# Patient Record
Sex: Female | Born: 1955 | Race: Black or African American | Hispanic: No | Marital: Single | Smoking: Never smoker
Health system: Southern US, Community
[De-identification: ages and names within clinical notes are randomized; demographics above are authoritative.]

## PROBLEM LIST (undated history)

## (undated) DIAGNOSIS — N19 Unspecified kidney failure: Secondary | ICD-10-CM

## (undated) DIAGNOSIS — I10 Essential (primary) hypertension: Secondary | ICD-10-CM

## (undated) DIAGNOSIS — E119 Type 2 diabetes mellitus without complications: Secondary | ICD-10-CM

---

## 2020-08-05 ENCOUNTER — Encounter (HOSPITAL_COMMUNITY): Payer: Self-pay

## 2020-08-05 ENCOUNTER — Other Ambulatory Visit: Payer: Self-pay

## 2020-08-05 ENCOUNTER — Emergency Department (HOSPITAL_COMMUNITY)
Admission: EM | Admit: 2020-08-05 | Discharge: 2020-08-05 | Disposition: A | Discharge status: Home / Self Care | Payer: Medicaid Other | Attending: Emergency Medicine | Admitting: Emergency Medicine

## 2020-08-05 DIAGNOSIS — Z7984 Long term (current) use of oral hypoglycemic drugs: Secondary | ICD-10-CM | POA: Diagnosis not present

## 2020-08-05 DIAGNOSIS — I10 Essential (primary) hypertension: Secondary | ICD-10-CM | POA: Diagnosis not present

## 2020-08-05 DIAGNOSIS — E119 Type 2 diabetes mellitus without complications: Secondary | ICD-10-CM | POA: Insufficient documentation

## 2020-08-05 DIAGNOSIS — Z760 Encounter for issue of repeat prescription: Secondary | ICD-10-CM | POA: Diagnosis not present

## 2020-08-05 DIAGNOSIS — Z79899 Other long term (current) drug therapy: Secondary | ICD-10-CM | POA: Insufficient documentation

## 2020-08-05 HISTORY — DX: Type 2 diabetes mellitus without complications: E11.9

## 2020-08-05 HISTORY — DX: Unspecified kidney failure: N19

## 2020-08-05 HISTORY — DX: Essential (primary) hypertension: I10

## 2020-08-05 MED ORDER — VALACYCLOVIR HCL 1 G PO TABS: 1000.0000 mg | ORAL_TABLET | Freq: Every day | ORAL | 1 refills | Status: AC

## 2020-08-05 MED ORDER — IRBESARTAN 300 MG PO TABS: 300.0000 mg | ORAL_TABLET | Freq: Every day | ORAL | 1 refills | Status: AC

## 2020-08-05 MED ORDER — AMLODIPINE BESYLATE 10 MG PO TABS: 10.0000 mg | ORAL_TABLET | Freq: Every day | ORAL | 1 refills | Status: AC

## 2020-08-05 MED ORDER — GLIMEPIRIDE 4 MG PO TABS: 4.0000 mg | ORAL_TABLET | Freq: Every day | ORAL | 1 refills | Status: AC

## 2020-08-05 MED ORDER — GLIMEPIRIDE 1 MG PO TABS: 1.0000 mg | ORAL_TABLET | Freq: Every day | ORAL | 1 refills | Status: AC

## 2020-08-05 NOTE — Discharge Instructions (Addendum)
Continue to work on finding a primary care provider to supply your medication needs and medical assessment.  Follow-up with them as soon as possible for a checkup.

## 2020-08-05 NOTE — ED Provider Notes (Signed)
[facility]-EMERGENCY DEPT Provider Note   CSN: [id number] Arrival date & time: 08/05/20  1237     History Chief Complaint  Patient presents with  . Medication Refill    Kristin Montoya is a 65 y.o. female.  HPI She presents for express purpose of a fall medications.  She is out of all her medicines with exception of glimepiride 4 mg.  She moved to [place] from [place], 2 months ago and is working on getting [employer].  That is in process and she expects to get it soon.  She is also working on finding a primary care provider.  She reports some shakiness, but denies other symptoms including; cough, chest pain, focal weakness or paresthesia.  There is been no nausea or vomiting.  There are no other known modifying factors.    Past Medical History:  Diagnosis Date  . Diabetes mellitus without complication (HCC)   . Hypertension   . Kidney failure     There are no problems to display for this patient.   History reviewed. No pertinent surgical history.   OB History   No obstetric history on file.     History reviewed. No pertinent family history.  Social History   Tobacco Use  . Smoking status: Never Smoker  . Smokeless tobacco: Never Used    Home Medications Prior to Admission medications   Medication Sig Start Date End Date Taking? Authorizing Provider  amLODipine (NORVASC) 10 MG tablet Take 1 tablet (10 mg total) by mouth daily. 08/05/20  Yes Mancel Bale, MD  glimepiride (AMARYL) 1 MG tablet Take 1 tablet (1 mg total) by mouth daily with breakfast. 08/05/20  Yes Mancel Bale, MD  glimepiride (AMARYL) 4 MG tablet Take 1 tablet (4 mg total) by mouth daily with breakfast. 08/05/20  Yes Mancel Bale, MD  irbesartan (AVAPRO) 300 MG tablet Take 1 tablet (300 mg total) by mouth daily. 08/05/20  Yes Mancel Bale, MD  valACYclovir (VALTREX) 1000 MG tablet Take 1 tablet (1,000 mg total) by mouth daily for 14 days. 08/05/20 08/19/20 Yes  Mancel Bale, MD    Allergies    Patient has no known allergies.  Review of Systems   Review of Systems  All other systems reviewed and are negative.   Physical Exam Updated Vital Signs BP (!) 180/88 (BP Location: Right Arm)   Pulse 94   Temp 97.7 F (36.5 C) (Oral)   Resp 17   SpO2 98%   Physical Exam Vitals and nursing note reviewed.  Constitutional:      General: She is not in acute distress.    Appearance: She is well-developed. She is obese. She is not ill-appearing, toxic-appearing or diaphoretic.  HENT:     Head: Normocephalic and atraumatic.     Right Ear: External ear normal.     Left Ear: External ear normal.  Eyes:     Conjunctiva/sclera: Conjunctivae normal.     Pupils: Pupils are equal, round, and reactive to light.  Neck:     Trachea: Phonation normal.  Cardiovascular:     Rate and Rhythm: Normal rate.  Pulmonary:     Effort: Pulmonary effort is normal.  Abdominal:     General: There is no distension.  Musculoskeletal:        General: Normal range of motion.     Cervical back: Normal range of motion and neck supple.  Skin:    General: Skin is warm and dry.  Neurological:  Mental Status: She is alert and oriented to person, place, and time.     Cranial Nerves: No cranial nerve deficit.     Sensory: No sensory deficit.     Motor: No abnormal muscle tone.     Coordination: Coordination normal.  Psychiatric:        Mood and Affect: Mood normal.        Behavior: Behavior normal.        Thought Content: Thought content normal.        Judgment: Judgment normal.     ED Results / Procedures / Treatments   Labs (all labs ordered are listed, but only abnormal results are displayed) Labs Reviewed - No data to display  EKG None  Radiology No results found.  Procedures Procedures   Medications Ordered in ED Medications - No data to display  ED Course  I have reviewed the triage vital signs and the nursing notes.  Pertinent labs &  imaging results that were available during my care of the patient were reviewed by me and considered in my medical decision making (see chart for details).    MDM Rules/Calculators/A&P                           Patient Vitals for the past 24 hrs:  BP Temp Temp src Pulse Resp SpO2  08/05/20 1245 (!) 180/88 97.7 F (36.5 C) Oral 94 17 98 %    1:29 PM Reevaluation with update and discussion. After initial assessment and treatment, an updated evaluation reveals no change in findings, findings discussed, questions answered. Mancel Bale   Medical Decision Making:  This patient is presenting for evaluation of requirement for refill of medications, which does not require a range of treatment options, and is not a complaint that involves a high risk of morbidity and mortality.   Critical Interventions-clinical evaluation, discussion with patient  After These Interventions, the Patient was reevaluated and was found stable for discharge.  Mild hypertension related to not having 2 of her blood pressure medications.  No clinical evidence for hypertensive urgency.  Doubt unstable metabolic process.  Will write prescriptions, to cover her until she can get a PCP, locally.  CRITICAL CARE-no Performed by: Mancel Bale  Nursing Notes Reviewed/ Care Coordinated Applicable Imaging Reviewed Interpretation of Laboratory Data incorporated into ED treatment  The patient appears reasonably screened and/or stabilized for discharge and I doubt any other medical condition or other [facility] requiring further screening, evaluation, or treatment in the ED at this time prior to discharge.  Plan: Home Medications-continue usual medication; Home Treatments-low-carb diet, low-salt diet; return here if the recommended treatment, does not improve the symptoms; Recommended follow up-PCP ASAP     Final Clinical Impression(s) / ED Diagnoses Final diagnoses:  Medication refill    Rx / DC Orders ED Discharge Orders          Ordered    glimepiride (AMARYL) 1 MG tablet  Daily with breakfast        08/05/20 1324    glimepiride (AMARYL) 4 MG tablet  Daily with breakfast        08/05/20 1324    irbesartan (AVAPRO) 300 MG tablet  Daily        08/05/20 1324    amLODipine (NORVASC) 10 MG tablet  Daily        08/05/20 1324    valACYclovir (VALTREX) 1000 MG tablet  Daily  08/05/20 1324           Mancel Bale, MD 08/05/20 1330

## 2020-08-05 NOTE — ED Triage Notes (Signed)
Pt arrived via walk in, states she has not been feeling well since she stopped taking her medications. States she has been out for about a month, and looking to get refills. Pt recently moved to the area and does not have a PCP established to refill meds yet.

## 2021-07-06 ENCOUNTER — Other Ambulatory Visit: Payer: Self-pay | Admitting: Family Medicine

## 2021-07-06 DIAGNOSIS — Z1231 Encounter for screening mammogram for malignant neoplasm of breast: Secondary | ICD-10-CM

## 2021-07-20 ENCOUNTER — Ambulatory Visit
Admission: RE | Admit: 2021-07-20 | Discharge: 2021-07-20 | Disposition: A | Discharge status: Home / Self Care | Payer: Medicaid Other | Source: Ambulatory Visit | Attending: Family Medicine | Admitting: Family Medicine

## 2021-07-20 ENCOUNTER — Other Ambulatory Visit: Payer: Self-pay | Admitting: Family Medicine

## 2021-07-20 DIAGNOSIS — R52 Pain, unspecified: Secondary | ICD-10-CM

## 2021-07-20 DIAGNOSIS — Z1231 Encounter for screening mammogram for malignant neoplasm of breast: Secondary | ICD-10-CM

## 2021-07-20 DIAGNOSIS — M898X8 Other specified disorders of bone, other site: Secondary | ICD-10-CM

## 2021-08-14 ENCOUNTER — Emergency Department (HOSPITAL_COMMUNITY)
Admission: EM | Admit: 2021-08-14 | Discharge: 2021-08-14 | Disposition: A | Discharge status: Home / Self Care | Payer: Medicaid Other | Attending: Emergency Medicine | Admitting: Emergency Medicine

## 2021-08-14 ENCOUNTER — Other Ambulatory Visit: Payer: Self-pay

## 2021-08-14 DIAGNOSIS — T383X5A Adverse effect of insulin and oral hypoglycemic [antidiabetic] drugs, initial encounter: Secondary | ICD-10-CM | POA: Diagnosis not present

## 2021-08-14 DIAGNOSIS — Z79899 Other long term (current) drug therapy: Secondary | ICD-10-CM | POA: Insufficient documentation

## 2021-08-14 DIAGNOSIS — Z7984 Long term (current) use of oral hypoglycemic drugs: Secondary | ICD-10-CM | POA: Insufficient documentation

## 2021-08-14 DIAGNOSIS — I10 Essential (primary) hypertension: Secondary | ICD-10-CM | POA: Diagnosis not present

## 2021-08-14 DIAGNOSIS — E119 Type 2 diabetes mellitus without complications: Secondary | ICD-10-CM | POA: Diagnosis not present

## 2021-08-14 DIAGNOSIS — R7989 Other specified abnormal findings of blood chemistry: Secondary | ICD-10-CM | POA: Insufficient documentation

## 2021-08-14 DIAGNOSIS — D72829 Elevated white blood cell count, unspecified: Secondary | ICD-10-CM | POA: Diagnosis not present

## 2021-08-14 DIAGNOSIS — R42 Dizziness and giddiness: Secondary | ICD-10-CM | POA: Insufficient documentation

## 2021-08-14 DIAGNOSIS — R5383 Other fatigue: Secondary | ICD-10-CM

## 2021-08-14 DIAGNOSIS — T50905A Adverse effect of unspecified drugs, medicaments and biological substances, initial encounter: Secondary | ICD-10-CM

## 2021-08-14 LAB — URINALYSIS, ROUTINE W REFLEX MICROSCOPIC
Bilirubin Urine: NEGATIVE
Glucose, UA: NEGATIVE mg/dL
Hgb urine dipstick: NEGATIVE
Ketones, ur: NEGATIVE mg/dL
Leukocytes,Ua: NEGATIVE
Nitrite: NEGATIVE
Protein, ur: NEGATIVE mg/dL
Specific Gravity, Urine: 1.012 (ref 1.005–1.030)
pH: 6 (ref 5.0–8.0)

## 2021-08-14 LAB — BASIC METABOLIC PANEL
Anion gap: 9 (ref 5–15)
BUN: 21 mg/dL (ref 8–23)
CO2: 25 mmol/L (ref 22–32)
Calcium: 11 mg/dL — ABNORMAL HIGH (ref 8.9–10.3)
Chloride: 102 mmol/L (ref 98–111)
Creatinine, Ser: 1.14 mg/dL — ABNORMAL HIGH (ref 0.44–1.00)
GFR, Estimated: 54 mL/min — ABNORMAL LOW (ref >60)
Glucose, Bld: 78 mg/dL (ref 70–99)
Potassium: 4 mmol/L (ref 3.5–5.1)
Sodium: 136 mmol/L (ref 135–145)

## 2021-08-14 LAB — CBC
HCT: 44.9 % (ref 36.0–46.0)
Hemoglobin: 14.5 g/dL (ref 12.0–15.0)
MCH: 27.8 pg (ref 26.0–34.0)
MCHC: 32.3 g/dL (ref 30.0–36.0)
MCV: 86.2 fL (ref 80.0–100.0)
Platelets: 303 10*3/uL (ref 150–400)
RBC: 5.21 MIL/uL — ABNORMAL HIGH (ref 3.87–5.11)
RDW: 13.6 % (ref 11.5–15.5)
WBC: 11.6 10*3/uL — ABNORMAL HIGH (ref 4.0–10.5)
nRBC: 0 % (ref 0.0–0.2)

## 2021-08-14 LAB — CBG MONITORING, ED: Glucose-Capillary: 71 mg/dL (ref 70–99)

## 2021-08-14 MED ORDER — SODIUM CHLORIDE 0.9 % IV BOLUS
1000.0000 mL | Freq: Once | INTRAVENOUS | Status: AC
  Administered 2021-08-14: 1000 mL via INTRAVENOUS

## 2021-08-14 NOTE — ED Provider Notes (Signed)
?[facility] ?Provider Note ? ? ?CSN: [id number] ?Arrival date & time: 08/14/21  1111 ? ?  ? ?History ? ?No chief complaint on file. ? ? ?Kristin AsaDenise Deming Montoya is a 66 y.o. female who presents emergency department complaining of dizziness x 1 week.  She states that she recently was started on Ozempic, and believes that her symptoms began after that.  She reports her symptoms are worse when she first gets up in the morning.  About 3 nights ago she did wake up and have blurry vision of the right eye which resolved on its own. She endorses regularly checks her blood sugar at home, and the highest reading that she is seeing has been about 80, and the lowest reading she seen has been about 50.  She also checked her blood pressure yesterday and had a systolic of 114, which is lower than she is used to. ? ?HPI ? ?  ? ?Home Medications ?Prior to Admission medications   ?Medication Sig Start Date End Date Taking? Authorizing Provider  ?amLODipine (NORVASC) 10 MG tablet Take 1 tablet (10 mg total) by mouth daily. 08/05/20   Mancel BaleWentz, Elliott, MD  ?glimepiride (AMARYL) 1 MG tablet Take 1 tablet (1 mg total) by mouth daily with breakfast. 08/05/20   Mancel BaleWentz, Elliott, MD  ?glimepiride (AMARYL) 4 MG tablet Take 1 tablet (4 mg total) by mouth daily with breakfast. 08/05/20   Mancel BaleWentz, Elliott, MD  ?irbesartan (AVAPRO) 300 MG tablet Take 1 tablet (300 mg total) by mouth daily. 08/05/20   Mancel BaleWentz, Elliott, MD  ?   ? ?Allergies    ?Patient has no known allergies.   ? ?Review of Systems   ?Review of Systems  ?Constitutional:  Negative for fever.  ?HENT:  Negative for congestion.   ?Eyes:  Positive for visual disturbance.  ?     Blurry vision  ?Respiratory:  Negative for cough and shortness of breath.   ?Cardiovascular:  Negative for chest pain.  ?Gastrointestinal:  Negative for abdominal pain, constipation, diarrhea, nausea and vomiting.  ?Neurological:  Positive for dizziness and weakness. Negative for syncope, speech  difficulty, numbness and headaches.  ?All other systems reviewed and are negative. ? ?Physical Exam ?Updated Vital Signs ?BP (!) 141/75   Pulse 82   Temp 97.9 ?F (36.6 ?C) (Oral)   Resp 17   Ht 5\' 5"  (1.651 m)   Wt 84.8 kg   SpO2 100%   BMI 31.12 kg/m?  ?Physical Exam ?Vitals and nursing note reviewed.  ?Constitutional:   ?   Appearance: Normal appearance. She is not toxic-appearing.  ?   Comments: Patient appears tired  ?HENT:  ?   Head: Normocephalic and atraumatic.  ?   Right Ear: Tympanic membrane, ear canal and external ear normal.  ?   Left Ear: Tympanic membrane, ear canal and external ear normal.  ?   Nose: Nose normal.  ?   Mouth/Throat:  ?   Mouth: Mucous membranes are moist.  ?Eyes:  ?   Extraocular Movements: Extraocular movements intact.  ?   Conjunctiva/sclera: Conjunctivae normal.  ?   Pupils: Pupils are equal, round, and reactive to light.  ?Cardiovascular:  ?   Rate and Rhythm: Normal rate and regular rhythm.  ?Pulmonary:  ?   Effort: Pulmonary effort is normal. No respiratory distress.  ?   Breath sounds: Normal breath sounds.  ?Abdominal:  ?   General: There is no distension.  ?   Palpations: Abdomen is soft.  ?  Tenderness: There is no abdominal tenderness.  ?Skin: ?   General: Skin is warm and dry.  ?Neurological:  ?   General: No focal deficit present.  ?   Mental Status: She is alert.  ?   Comments: Neuro: Speech is clear, able to follow commands. CN III-XII intact grossly intact. PERRLA. EOMI. Sensation intact throughout. Str 5/5 all extremities.  ? ? ?ED Results / Procedures / Treatments   ?Labs ?(all labs ordered are listed, but only abnormal results are displayed) ?Labs Reviewed  ?BASIC METABOLIC PANEL - Abnormal; Notable for the following components:  ?    Result Value  ? Creatinine, Ser 1.14 (*)   ? Calcium 11.0 (*)   ? GFR, Estimated 54 (*)   ? All other components within normal limits  ?CBC - Abnormal; Notable for the following components:  ? WBC 11.6 (*)   ? RBC 5.21 (*)   ?  All other components within normal limits  ?URINALYSIS, ROUTINE W REFLEX MICROSCOPIC - Abnormal; Notable for the following components:  ? Color, Urine STRAW (*)   ? APPearance HAZY (*)   ? All other components within normal limits  ?CBG MONITORING, ED  ? ? ?EKG ?EKG Interpretation ? ?Date/Time:  Saturday August 14 2021 11:34:54 EDT ?Ventricular Rate:  84 ?PR Interval:  154 ?QRS Duration: 82 ?QT Interval:  351 ?QTC Calculation: 415 ?R Axis:   27 ?Text Interpretation: Sinus rhythm Abnormal R-wave progression, early transition Confirmed by Coralee Pesa [phone]) on 08/14/2021 1:26:34 PM ? ?Radiology ?No results found. ? ?Procedures ?Procedures  ? ? ?Medications Ordered in ED ?Medications  ?sodium chloride 0.9 % bolus 1,000 mL (0 mLs Intravenous Stopped 08/14/21 1418)  ? ? ?ED Course/ Medical Decision Making/ A&P ?  ?                        ?Medical Decision Making ?Amount and/or Complexity of Data Reviewed ?Labs: ordered. ? ? ?This patient presents to the ED for concern of dizziness, this involves an extensive number of treatment options, and is a complaint that carries with it a high risk of complications and morbidity. The emergent differential diagnosis prior to evaluation includes, but is not limited to, Benign paroxysmal positional vertigo, vestibular migraine, head trauma, vertebrobasilar insuffciency, AVM, intracranial tumor, multiple sclerosis, drug-related (anticonvulsants, antibiotics, hypnotics, analgesics, alcohol), CVA, vasovagal syncope, orthostatic hypotension, sepsis, hypoglycemia, electrolyte disturbance, symptomatic anemia, dehydration, anxiety/panic attack. ? ?This is not an exhaustive differential.  ? ?Past Medical History / Co-morbidities / Social History: ?Diabetes, hypertension, kidney failure ? ?Additional history: ?Additional history obtained from chart review. External records from outside source obtained and reviewed including patient initially started on these medications reported back in 2022,  the Ozempic was just added this week. ? ?Physical Exam: ?Physical exam performed. The pertinent findings include: Patient is afebrile, not tachycardic, not hypoxic, no acute distress.  Normal neurologic exam as above.  Patient appears tired, but overall nontoxic-appearing.  No aggravating or alleviating factors or maneuvers. ? ?Lab Tests: ?I ordered, and personally interpreted labs.  The pertinent results include: Mild leukocytosis of 11,600, likely incidental finding.  Normal hemoglobin.  Creatinine 1.14, no labs to compare to.  Urinalysis negative for infection.  CBG 71. ?  ?Cardiac Monitoring:  ?The patient was maintained on a cardiac monitor.  My attending physician Dr. Wilkie Aye viewed and interpreted the cardiac monitored which showed an underlying rhythm of: normal sinus rhythm. I agree with this interpretation.  ?  ?Disposition: ?After  consideration of the diagnostic results and the patients response to treatment, I feel that patient is not requiring admission or inpatient treatment for her symptoms.  Patient was given a liter of IV fluids and was able to ambulate to the restroom without difficulty.  She states she only feels mildly improved.  During her 3-1/2-hour observation in the emergency department she had no worsening of her symptoms.  I think her symptoms are likely related to her recent medication change of Ozempic.  I advised the patient to stop this medication, and follow-up with her primary doctor.  Overall her lab work and physical exam have been reassuring today, and I have low suspicion for other acute etiology as in her symptoms.  We have discussed reasons to return to the emergency department, and patient is agreeable to the plan. ? ?I discussed this case with my attending physician Dr. Wilkie Aye who cosigned this note including patient's presenting symptoms, physical exam, and planned diagnostics and interventions. Attending physician stated agreement with plan or made changes to plan which were  implemented.   ? ?Final Clinical Impression(s) / ED Diagnoses ?Final diagnoses:  ?Dizziness  ?Fatigue, unspecified type  ?Adverse effect of drug, initial encounter  ? ? ?Rx / DC Orders ?ED Discharge Orders

## 2021-08-14 NOTE — ED Notes (Signed)
Pt ambulated to bathroom without difficulty, no respiratory distress noted.  ?

## 2021-08-14 NOTE — Discharge Instructions (Addendum)
You are seen in the emergency department today for dizziness. ? ?As we discussed all your lab work has looked very reassuring today.  I think that your symptoms are likely related to starting the Ozempic.  I'd like you to stop taking this and to you can follow-up with your primary doctor.  You can either give them a call this weekend, or first thing on Monday to schedule a follow-up appointment.  I hope that your symptoms will start to improve once the medicine leaves your system. ? ?Make sure you're still eating and drinking well at home. You can continue your other medicines.  ? ?Continue to monitor how you're doing and return to the ER for new or worsening symptoms.  ?

## 2021-08-14 NOTE — ED Notes (Signed)
DC instructions reviewed with pt. PT verbalized understanding. PT DC °

## 2021-08-14 NOTE — ED Triage Notes (Signed)
Pt came in [place] for dizzyness.  Recently started on Ozempic, glimepiride, and irbesartan and norvasc. States sugars have been up and down and lbood pressure was 114 SBP yesterday which is lower than she is used to.  ?

## 2021-09-01 ENCOUNTER — Encounter: Payer: Self-pay | Admitting: Student

## 2021-09-01 ENCOUNTER — Ambulatory Visit (INDEPENDENT_AMBULATORY_CARE_PROVIDER_SITE_OTHER): Payer: Medicaid Other | Admitting: Student

## 2021-09-01 VITALS — BP 134/66 | HR 85 | Temp 98.2°F | Ht 65.0 in | Wt 189.0 lb

## 2021-09-01 DIAGNOSIS — J398 Other specified diseases of upper respiratory tract: Secondary | ICD-10-CM

## 2021-09-01 NOTE — Progress Notes (Signed)
? ?Synopsis: Referred for tracheal deviation by Verlon Au, MD ? ?Subjective:  ? ?PATIENT ID: Kristin Montoya GENDER: female DOB: Sep 04, 1955, MRN: [id number] ? ?Chief Complaint  ?Patient presents with  ? Pulmonary Consult  ?  Referred by Dr. Roxan Hockey for eval of abnormal cxr done 07/20/21. Pt denies any respiratory co's.   ?  ? ?66yF with history of DM, HTN, referred for tracheal deviation. ? ?No DOE. She has only occasional cough. She is not bothered by it. No dysphagia, odynophagia, change in voice, neck pain.  ? ?Otherwise pertinent review of systems is negative. ? ?No family history of lung cancer or other malignancy.  ? ?She has never smoked cigarettes. She has smoked MJ for 30-40 years 2 joints/day. No vaping. She was a Runner, broadcasting/film/video (kindergarten). She is from [place] [facility] [place] has also lived in [place]. Has been here over a year, moved for family.  ? ?Past Medical History:  ?Diagnosis Date  ? Diabetes mellitus without complication (HCC)   ? Hypertension   ? Kidney failure   ?  ? ?Family History  ?Problem Relation Age of Onset  ? Breast cancer Neg Hx   ?  ? ?No past surgical history on file. ? ?Social History  ? ?Socioeconomic History  ? Marital status: Single  ?  Spouse name: Not on file  ? Number of children: Not on file  ? Years of education: Not on file  ? Highest education level: Not on file  ?Occupational History  ? Not on file  ?Tobacco Use  ? Smoking status: Never  ? Smokeless tobacco: Never  ?Vaping Use  ? Vaping Use: Never used  ?Substance and Sexual Activity  ? Alcohol use: Not on file  ? Drug use: Yes  ?  Types: Marijuana  ?  Comment: daily use  ? Sexual activity: Not on file  ?Other Topics Concern  ? Not on file  ?Social History Narrative  ? Not on file  ? ?Social Determinants of Health  ? ?Financial Resource Strain: Not on file  ?Food Insecurity: Not on file  ?Transportation Needs: Not on file  ?Physical Activity: Not on file  ?Stress: Not on file  ?Social Connections: Not on file  ?Intimate Partner  Violence: Not on file  ?  ? ?No Known Allergies  ? ?Outpatient Medications Prior to Visit  ?Medication Sig Dispense Refill  ? amLODipine (NORVASC) 10 MG tablet Take 1 tablet (10 mg total) by mouth daily. 30 tablet 1  ? atorvastatin (LIPITOR) 40 MG tablet Take 40 mg by mouth daily.    ? glimepiride (AMARYL) 1 MG tablet Take 1 tablet (1 mg total) by mouth daily with breakfast. 30 tablet 1  ? glimepiride (AMARYL) 4 MG tablet Take 1 tablet (4 mg total) by mouth daily with breakfast. 30 tablet 1  ? hydrochlorothiazide (HYDRODIURIL) 25 MG tablet Take 25 mg by mouth daily.    ? irbesartan (AVAPRO) 300 MG tablet Take 1 tablet (300 mg total) by mouth daily. 30 tablet 1  ? ?No facility-administered medications prior to visit.  ? ? ? ? ? ?Objective:  ? ?Physical Exam: ? ?General appearance: 66 y.o., female, NAD, conversant  ?Eyes: anicteric sclerae; PERRL, tracking appropriately ?HENT: NCAT; MMM ?Neck: Trachea midline; no lymphadenopathy, no JVD ?Lungs: CTAB, no crackles, no wheeze, with normal respiratory effort ?CV: RRR, no murmur  ?Abdomen: Soft, non-tender; non-distended, BS present  ?Extremities: No peripheral edema, warm ?Skin: Normal turgor and texture; no rash ?Psych: Appropriate affect ?Neuro: Alert and oriented  to person and place, no focal deficit  ? ? ? ?Vitals:  ? 09/01/21 1431  ?BP: 134/66  ?Pulse: 85  ?Temp: 98.2 ?F (36.8 ?C)  ?TempSrc: Oral  ?SpO2: 98%  ?Weight: 189 lb (85.7 kg)  ?Height: 5\' 5"  (1.651 m)  ? ?98% on RA ?BMI Readings from Last 3 Encounters:  ?09/01/21 31.45 kg/m?  ?08/14/21 31.12 kg/m?  ? ?Wt Readings from Last 3 Encounters:  ?09/01/21 189 lb (85.7 kg)  ?08/14/21 187 lb (84.8 kg)  ? ? ? ?CBC ?   ?Component Value Date/Time  ? WBC 11.6 (H) 08/14/2021 1154  ? RBC 5.21 (H) 08/14/2021 1154  ? HGB 14.5 08/14/2021 1154  ? HCT 44.9 08/14/2021 1154  ? PLT 303 08/14/2021 1154  ? MCV 86.2 08/14/2021 1154  ? MCH 27.8 08/14/2021 1154  ? MCHC 32.3 08/14/2021 1154  ? RDW 13.6 08/14/2021 1154  ? ? ? ?Chest  Imaging: ?CXR 07/20/21 reviewed by me with rightward tracheal deviation ? ?Pulmonary Functions Testing Results: ?   ? View : No data to display.  ?  ?  ?  ? ?   ?Assessment & Plan:  ? ?# Rightward tracheal deviation: ? ?Plan: ?- doesn't seem like she's at remarkably increased risk for head/neck or thyroid issues but we agreed to proceed with ct chest w contrast, ct neck w contrast.  ? ?RTC 4 weeks to discuss results ? ? ? ? ?07/22/21, MD ?[facility] ?09/01/2021 6:33 PM  ? ?

## 2021-09-01 NOTE — Patient Instructions (Addendum)
-   you will be called to schedule CT neck and chest. Send message or call with any questions about results otherwise I will see you in 4 weeks to discuss.  ?

## 2021-09-17 ENCOUNTER — Emergency Department (HOSPITAL_COMMUNITY): Payer: Medicaid Other

## 2021-09-17 ENCOUNTER — Encounter (HOSPITAL_COMMUNITY): Payer: Self-pay

## 2021-09-17 ENCOUNTER — Other Ambulatory Visit: Payer: Self-pay

## 2021-09-17 ENCOUNTER — Emergency Department (HOSPITAL_COMMUNITY)
Admission: EM | Admit: 2021-09-17 | Discharge: 2021-09-18 | Disposition: A | Discharge status: Home / Self Care | Payer: Medicaid Other | Attending: Student | Admitting: Student

## 2021-09-17 ENCOUNTER — Other Ambulatory Visit: Payer: Medicaid Other

## 2021-09-17 DIAGNOSIS — E042 Nontoxic multinodular goiter: Secondary | ICD-10-CM | POA: Insufficient documentation

## 2021-09-17 DIAGNOSIS — R7989 Other specified abnormal findings of blood chemistry: Secondary | ICD-10-CM | POA: Insufficient documentation

## 2021-09-17 DIAGNOSIS — E1122 Type 2 diabetes mellitus with diabetic chronic kidney disease: Secondary | ICD-10-CM | POA: Diagnosis not present

## 2021-09-17 DIAGNOSIS — I129 Hypertensive chronic kidney disease with stage 1 through stage 4 chronic kidney disease, or unspecified chronic kidney disease: Secondary | ICD-10-CM | POA: Insufficient documentation

## 2021-09-17 DIAGNOSIS — Z79899 Other long term (current) drug therapy: Secondary | ICD-10-CM | POA: Diagnosis not present

## 2021-09-17 DIAGNOSIS — R0789 Other chest pain: Secondary | ICD-10-CM | POA: Insufficient documentation

## 2021-09-17 DIAGNOSIS — N183 Chronic kidney disease, stage 3 unspecified: Secondary | ICD-10-CM | POA: Diagnosis not present

## 2021-09-17 DIAGNOSIS — R0602 Shortness of breath: Secondary | ICD-10-CM | POA: Diagnosis not present

## 2021-09-17 DIAGNOSIS — R079 Chest pain, unspecified: Secondary | ICD-10-CM

## 2021-09-17 DIAGNOSIS — D72829 Elevated white blood cell count, unspecified: Secondary | ICD-10-CM | POA: Diagnosis not present

## 2021-09-17 DIAGNOSIS — Z7984 Long term (current) use of oral hypoglycemic drugs: Secondary | ICD-10-CM | POA: Insufficient documentation

## 2021-09-17 DIAGNOSIS — M542 Cervicalgia: Secondary | ICD-10-CM | POA: Diagnosis present

## 2021-09-17 LAB — CBC
HCT: 49.1 % — ABNORMAL HIGH (ref 36.0–46.0)
Hemoglobin: 16.1 g/dL — ABNORMAL HIGH (ref 12.0–15.0)
MCH: 28 pg (ref 26.0–34.0)
MCHC: 32.8 g/dL (ref 30.0–36.0)
MCV: 85.2 fL (ref 80.0–100.0)
Platelets: 355 10*3/uL (ref 150–400)
RBC: 5.76 MIL/uL — ABNORMAL HIGH (ref 3.87–5.11)
RDW: 13 % (ref 11.5–15.5)
WBC: 12.5 10*3/uL — ABNORMAL HIGH (ref 4.0–10.5)
nRBC: 0 % (ref 0.0–0.2)

## 2021-09-17 LAB — TROPONIN I (HIGH SENSITIVITY): Troponin I (High Sensitivity): 6 ng/L (ref <18)

## 2021-09-17 LAB — BASIC METABOLIC PANEL
Anion gap: 17 — ABNORMAL HIGH (ref 5–15)
BUN: 27 mg/dL — ABNORMAL HIGH (ref 8–23)
CO2: 17 mmol/L — ABNORMAL LOW (ref 22–32)
Calcium: 11.4 mg/dL — ABNORMAL HIGH (ref 8.9–10.3)
Chloride: 101 mmol/L (ref 98–111)
Creatinine, Ser: 1.5 mg/dL — ABNORMAL HIGH (ref 0.44–1.00)
GFR, Estimated: 39 mL/min — ABNORMAL LOW (ref >60)
Glucose, Bld: 92 mg/dL (ref 70–99)
Potassium: 4.3 mmol/L (ref 3.5–5.1)
Sodium: 135 mmol/L (ref 135–145)

## 2021-09-17 MED ORDER — IOHEXOL 300 MG/ML  SOLN
100.0000 mL | Freq: Once | INTRAMUSCULAR | Status: AC | PRN
  Administered 2021-09-17: 100 mL via INTRAVENOUS

## 2021-09-17 MED ORDER — LACTATED RINGERS IV BOLUS
500.0000 mL | Freq: Once | INTRAVENOUS | Status: AC
  Administered 2021-09-17: 500 mL via INTRAVENOUS

## 2021-09-17 MED ORDER — LACTATED RINGERS IV BOLUS
1000.0000 mL | Freq: Once | INTRAVENOUS | Status: AC
Start: 2021-09-18 — End: 2021-09-18
  Administered 2021-09-18: 1000 mL via INTRAVENOUS

## 2021-09-17 NOTE — ED Notes (Signed)
Unable to get blood for second trop ?

## 2021-09-17 NOTE — ED Triage Notes (Signed)
Pt BIB [facility] from the drs office c/o CP and SHOB x1 week. Pt does have a mass that is tender and palpable to her sternum. Pt is scheduled for a CT for 5/2 but could not wait till then.  ?

## 2021-09-17 NOTE — ED Notes (Signed)
Pt states she would rather not be stuck again if they can try the IV first ?

## 2021-09-17 NOTE — ED Provider Triage Note (Signed)
Emergency Medicine Provider Triage Evaluation Note ? ?Kristin Montoya , a 66 y.o. female  was evaluated in triage.  Pt complains of chest pain.  She states that she has had this chest pain across her chest all week.  It has been constant.  She says it feels like pressure.   She says that breathing makes it worse.  Nothing is made it better.  She has never had this pain before.  She states she has had a mass in her sternal region that she was was to get a CT scan for today, but her doctor ended up sending her here because she is having some chest pain.  Patient is associated shortness of breath. ? ?Review of Systems  ?Positive: Chest pain, shortness of breath ?Negative:  ? ?Physical Exam  ?BP (!) 138/93 (BP Location: Left Arm)   Pulse 96   Temp 98 ?F (36.7 ?C) (Oral)   Resp 15   Ht 5\' 5"  (1.651 m)   Wt 84.8 kg   SpO2 100%   BMI 31.12 kg/m?  ?Gen:   Awake, no distress   ?Resp:  Normal effort  ?MSK:   Moves extremities without difficulty  ?Other: Heart regular rate and rhythm.  No murmurs.  Lung sounds clear bilaterally.  No pulse deficits. ? ?Medical Decision Making  ?Medically screening exam initiated at 1:28 PM.  Appropriate orders placed.  Riannon Mukherjee V was informed that the remainder of the evaluation will be completed by another provider, this initial triage assessment does not replace that evaluation, and the importance of remaining in the ED until their evaluation is complete. ? ? ?  ?Allie Dimmer, PA-C ?09/17/21 1329 ? ?

## 2021-09-18 LAB — TROPONIN I (HIGH SENSITIVITY): Troponin I (High Sensitivity): 9 ng/L (ref <18)

## 2021-09-18 LAB — T4, FREE: Free T4: 0.93 ng/dL (ref 0.61–1.12)

## 2021-09-18 LAB — TSH: TSH: 1.238 u[IU]/mL (ref 0.350–4.500)

## 2021-09-18 NOTE — ED Provider Notes (Signed)
?[facility] ?Provider Note ? ? ?CSN: [id number] ?Arrival date & time: 09/17/21  1302 ? ?  ? ?History ?Chief Complaint  ?Patient presents with  ? Chest Pain  ? ? ?Kristin Montoya is a 66 y.o. female with h/o tracheal deviation, HTN, DM presents to the ED for evaluation of central chest pain and anterior neck pain for the past week.  She reports she feels better when she is lying on her right side but mainly hurts with change of position and palpation.  She reports some intermittent shortness of breath when this first began but has since improved.  She reports fatigue and a decrease in appetite.  She reports occasional nausea but denies any vomiting or any abdominal pain.  She denies any cough or cold symptoms.  Denies any fever,palpitations, leg swelling, melena, hematochezia, dysuria or hematuria. She was a daily MJ user for 40 years and quit two weeks ago. ? ? ?Chest Pain ?Associated symptoms: fatigue, nausea and shortness of breath   ?Associated symptoms: no abdominal pain, no cough, no dizziness, no palpitations and no vomiting   ? ?  ? ?Home Medications ?Prior to Admission medications   ?Medication Sig Start Date End Date Taking? Authorizing Provider  ?amLODipine (NORVASC) 10 MG tablet Take 1 tablet (10 mg total) by mouth daily. 08/05/20   Daleen Bo, MD  ?atorvastatin (LIPITOR) 40 MG tablet Take 40 mg by mouth daily.    [provider]  ?glimepiride (AMARYL) 1 MG tablet Take 1 tablet (1 mg total) by mouth daily with breakfast. 08/05/20   Daleen Bo, MD  ?glimepiride (AMARYL) 4 MG tablet Take 1 tablet (4 mg total) by mouth daily with breakfast. 08/05/20   Daleen Bo, MD  ?hydrochlorothiazide (HYDRODIURIL) 25 MG tablet Take 25 mg by mouth daily.    [provider]  ?irbesartan (AVAPRO) 300 MG tablet Take 1 tablet (300 mg total) by mouth daily. 08/05/20   Daleen Bo, MD  ?   ? ?Allergies    ?Patient has no known allergies.   ? ?Review of Systems    ?Review of Systems  ?Constitutional:  Positive for appetite change and fatigue.  ?HENT:  Negative for congestion, rhinorrhea and sore throat.   ?Respiratory:  Positive for shortness of breath. Negative for cough.   ?Cardiovascular:  Positive for chest pain. Negative for palpitations and leg swelling.  ?Gastrointestinal:  Positive for nausea. Negative for abdominal pain, constipation, diarrhea and vomiting.  ?Genitourinary:  Negative for dysuria and hematuria.  ?Musculoskeletal:  Positive for neck pain.  ?Neurological:  Negative for dizziness and light-headedness.  ? ?Physical Exam ?Updated Vital Signs ?BP (!) 143/76   Pulse 90   Temp 98.4 ?F (36.9 ?C) (Oral)   Resp 19   Ht 5\' 5"  (1.651 m)   Wt 84.8 kg   SpO2 98%   BMI 31.12 kg/m?  ?Physical Exam ?Vitals and nursing note reviewed.  ?Constitutional:   ?   General: She is not in acute distress. ?   Appearance: Normal appearance. She is not ill-appearing or toxic-appearing.  ?HENT:  ?   Head: Normocephalic and atraumatic.  ?Eyes:  ?   General: No scleral icterus. ?Neck:  ?   Thyroid: Thyroid tenderness present. No thyroid mass.  ?Cardiovascular:  ?   Rate and Rhythm: Normal rate and regular rhythm.  ?   Pulses:     ?     Radial pulses are 2+ on the right side and 2+ on the left  side.  ?     Dorsalis pedis pulses are 2+ on the right side and 2+ on the left side.  ?     Posterior tibial pulses are 2+ on the right side and 2+ on the left side.  ?   Heart sounds: No murmur heard. ?Pulmonary:  ?   Effort: Pulmonary effort is normal. No respiratory distress.  ?   Breath sounds: Normal breath sounds. No decreased breath sounds.  ?Chest:  ?   Chest wall: Tenderness present. No mass, deformity or crepitus.  ?   Comments: Centralized chest tenderness near the sternal notch and superiorly. No crepitus or obvious mass appreciated. No overlying skin changes noted. No increase warmth. ?Abdominal:  ?   General: Abdomen is flat. Bowel sounds are normal.  ?   Palpations: Abdomen  is soft.  ?Musculoskeletal:     ?   General: No deformity.  ?   Cervical back: Normal range of motion and neck supple. No spinous process tenderness or muscular tenderness.  ?   Right lower leg: No tenderness. No edema.  ?   Left lower leg: No tenderness. No edema.  ?Skin: ?   General: Skin is warm and dry.  ?Neurological:  ?   General: No focal deficit present.  ?   Mental Status: She is alert. Mental status is at baseline.  ? ? ?ED Results / Procedures / Treatments   ?Labs ?(all labs ordered are listed, but only abnormal results are displayed) ?Labs Reviewed  ?BASIC METABOLIC PANEL - Abnormal; Notable for the following components:  ?    Result Value  ? CO2 17 (*)   ? BUN 27 (*)   ? Creatinine, Ser 1.50 (*)   ? Calcium 11.4 (*)   ? GFR, Estimated 39 (*)   ? Anion gap 17 (*)   ? All other components within normal limits  ?CBC - Abnormal; Notable for the following components:  ? WBC 12.5 (*)   ? RBC 5.76 (*)   ? Hemoglobin 16.1 (*)   ? HCT 49.1 (*)   ? All other components within normal limits  ?TSH  ?T3  ?T4, FREE  ?TROPONIN I (HIGH SENSITIVITY)  ?TROPONIN I (HIGH SENSITIVITY)  ? ? ?EKG ?EKG Interpretation ? ?Date/Time:  Friday September 17 2021 13:08:49 EDT ?Ventricular Rate:  98 ?PR Interval:  152 ?QRS Duration: 70 ?QT Interval:  340 ?QTC Calculation: 434 ?R Axis:   56 ?Text Interpretation: Normal sinus rhythm Nonspecific ST abnormality Abnormal ECG When compared with ECG of 14-Aug-2021 11:34, PREVIOUS ECG IS PRESENT since last tracing no significant change Confirmed by Rolan Bucco [phone]) on 09/18/2021 11:19:03 AM ? ?Radiology ?DG Chest 2 View ? ?Result Date: 09/17/2021 ?CLINICAL DATA:  Chest pain, shortness of breath EXAM: CHEST - 2 VIEW COMPARISON:  07/20/2021 FINDINGS: The heart size and mediastinal contours are within normal limits. Unchanged rightward deviation of the trachea the level of the thoracic inlet. No focal airspace consolidation, pleural effusion, or pneumothorax. The visualized skeletal structures  are unremarkable. IMPRESSION: 1. No active cardiopulmonary disease. 2. Unchanged rightward deviation of the trachea at the level of the thoracic inlet. Electronically Signed   By: Duanne Guess D.O.   On: 09/17/2021 13:50  ? ?CT Soft Tissue Neck W Contrast ? ?Result Date: 09/17/2021 ?CLINICAL DATA:  Possible thyroid nodule right tracheal deviation EXAM: CT NECK WITH CONTRAST TECHNIQUE: Multidetector CT imaging of the neck was performed using the standard protocol following the bolus administration of intravenous contrast. RADIATION  DOSE REDUCTION: This exam was performed according to the departmental dose-optimization program which includes automated exposure control, adjustment of the mA and/or kV according to patient size and/or use of iterative reconstruction technique. CONTRAST:  122mL OMNIPAQUE IOHEXOL 300 MG/ML  SOLN COMPARISON:  None. FINDINGS: Pharynx and larynx: Rightward deviation of the trachea, due to enlarged left thyroid lobe (see below),. The larynx and pharynx are otherwise unremarkable. Salivary glands: No inflammation, mass, or stone. Thyroid: Significant enlargement of the left thyroid lobe, likely with multiple nodules, concerning for multinodular goiter. Lymph nodes: None enlarged or abnormal density. Vascular: Patent vasculature. Limited intracranial: Negative. Visualized orbits: No acute finding. Mastoids and visualized paranasal sinuses: Mucous retention cysts in the left maxillary sinus. Otherwise clear paranasal sinuses. The mastoids are well aerated. Skeleton: No acute osseous abnormality. Poor dentition, with periapical lucency about the roots of the right maxillary molars. Upper chest: Please see same-day CT chest Other: None. IMPRESSION: Enlarged left thyroid lobe, with multiple nodules, concerning for multinodular goiter which causes rightward deviation of the trachea. If this has not previously been evaluated, an ultrasound of the thyroid is recommended. (Reference: J Am Coll  Radiol. 2015 Feb;12(2): 143-50) Electronically Signed   By: Merilyn Baba M.D.   On: 09/17/2021 23:44  ? ?CT Chest W Contrast ? ?Result Date: 09/17/2021 ?CLINICAL DATA:  Chest pain. EXAM: CT CHEST WITH CONTRAST TECH

## 2021-09-18 NOTE — Discharge Instructions (Addendum)
You were seen in the ER today for evaluation of your chest pain and neck fullness. Your lab work did not show any sign of a heart attack. Your imaging showed multinodular goiter on your thyroid. I have placed an order for you to have an ultrasound of your thyroid done. You will need to follow up with your PCP for follow up on the ultrasound as well as a referral to have these biopsied. If you have any concern, new or worsening symptoms, please return to the nearest ER for re-evaluation.  ? ?Contact a health care provider if: ?You have trouble sleeping. ?You have muscle weakness. ?You have significant weight loss without changing your eating habits. ?You feel nervous. ?You have trouble swallowing. ?You have increased swelling. ?You have a rapid or irregular heartbeat. ?Get help right away if: ?You have chest pain. ?You faint or lose consciousness. ?Your nodule makes it hard for you to breathe. ?These symptoms may be an emergency. Get help right away. Call 911. ?Do not wait to see if the symptoms will go away. ?Do not drive yourself to the hospital. ?

## 2021-09-18 NOTE — ED Notes (Signed)
Patient verbalizes understanding of d/c instructions. Opportunities for questions and answers were provided. Pt d/c from ED and ambulated to lobby.  

## 2021-09-19 LAB — T3: T3, Total: 85 ng/dL (ref 71–180)

## 2021-09-21 ENCOUNTER — Inpatient Hospital Stay: Admission: RE | Admit: 2021-09-21 | Payer: Medicaid Other | Source: Ambulatory Visit

## 2021-09-27 NOTE — Progress Notes (Deleted)
Synopsis: Referred for tracheal deviation by Kristin Been, FNP  Subjective:   PATIENT ID: Kristin Montoya GENDER: female DOB: 07-14-1955, MRN: [id number]  No chief complaint on file.  66yF with history of DM, HTN, referred for tracheal deviation.  No DOE. She has only occasional cough. She is not bothered by it. No dysphagia, odynophagia, change in voice, neck pain.   Otherwise pertinent review of systems is negative.  No family history of lung cancer or other malignancy.   She has never smoked cigarettes. She has smoked MJ for 30-40 years 2 joints/day. No vaping. She was a Runner, broadcasting/film/video (kindergarten). She is from [place] [facility] has also lived in [place]. Has Montoya here over a year, moved for family.   Past Medical History:  Diagnosis Date   Diabetes mellitus without complication (HCC)    Hypertension    Kidney failure      Family History  Problem Relation Age of Onset   Breast cancer Neg Hx      No past surgical history on file.  Social History   Socioeconomic History   Marital status: Single    Spouse name: Not on file   Number of children: Not on file   Years of education: Not on file   Highest education level: Not on file  Occupational History   Not on file  Tobacco Use   Smoking status: Never   Smokeless tobacco: Never  Vaping Use   Vaping Use: Never used  Substance and Sexual Activity   Alcohol use: Not on file   Drug use: Yes    Types: Marijuana    Comment: daily use   Sexual activity: Not on file  Other Topics Concern   Not on file  Social History Narrative   Not on file   Social Determinants of Health   Financial Resource Strain: Not on file  Food Insecurity: Not on file  Transportation Needs: Not on file  Physical Activity: Not on file  Stress: Not on file  Social Connections: Not on file  Intimate Partner Violence: Not on file     No Known Allergies   Outpatient Medications Prior to Visit  Medication Sig Dispense Refill   amLODipine (NORVASC)  10 MG tablet Take 1 tablet (10 mg total) by mouth daily. 30 tablet 1   atorvastatin (LIPITOR) 40 MG tablet Take 40 mg by mouth daily.     glimepiride (AMARYL) 1 MG tablet Take 1 tablet (1 mg total) by mouth daily with breakfast. 30 tablet 1   glimepiride (AMARYL) 4 MG tablet Take 1 tablet (4 mg total) by mouth daily with breakfast. 30 tablet 1   hydrochlorothiazide (HYDRODIURIL) 25 MG tablet Take 25 mg by mouth daily.     irbesartan (AVAPRO) 300 MG tablet Take 1 tablet (300 mg total) by mouth daily. 30 tablet 1   No facility-administered medications prior to visit.       Objective:   Physical Exam:  General appearance: 66 y.o., female, NAD, conversant, female, NAD, conversant  Eyes: anicteric sclerae; PERRL, tracking appropriately HENT: NCAT; MMM Neck: Trachea midline; no lymphadenopathy, no JVD Lungs: CTAB, no crackles, no wheeze, with normal respiratory effort CV: RRR, no murmur  Abdomen: Soft, non-tender; non-distended, BS present  Extremities: No peripheral edema, warm Skin: Normal turgor and texture; no rash Psych: Appropriate affect Neuro: Alert and oriented to person and place, no focal deficit     There were no vitals filed for this visit.    on RA BMI Readings from Last  3 Encounters:  09/17/21 31.12 kg/m  09/01/21 31.45 kg/m  08/14/21 31.12 kg/m   Wt Readings from Last 3 Encounters:  09/17/21 187 lb (84.8 kg)  09/01/21 189 lb (85.7 kg)  08/14/21 187 lb (84.8 kg)     CBC    Component Value Date/Time   WBC 12.5 (H) 09/17/2021 1331   RBC 5.76 (H) 09/17/2021 1331   HGB 16.1 (H) 09/17/2021 1331   HCT 49.1 (H) 09/17/2021 1331   PLT 355 09/17/2021 1331   MCV 85.2 09/17/2021 1331   MCH 28.0 09/17/2021 1331   MCHC 32.8 09/17/2021 1331   RDW 13.0 09/17/2021 1331     Chest Imaging: CXR 07/20/21 reviewed by me with rightward tracheal deviation  CT neck 09/17/21 reviewed by me with multinodular goiter  Pulmonary Functions Testing Results:     View : No data to display.             Assessment & Plan:   # Goiter:  Plan: - thyroid US - spirometry - ENT referral?   RTC 4 weeks to discuss results     Omar Person, MD [facility] 09/27/2021 4:37 PM

## 2021-09-29 ENCOUNTER — Ambulatory Visit: Payer: Medicaid Other | Admitting: Student

## 2023-07-23 IMAGING — CR DG CHEST 2V
2 series · 2 of 2 positions shown · non-contrast
Comparison: 07/20/2021

CLINICAL DATA: Chest pain, shortness of breath

EXAM:
CHEST - 2 VIEW

[chest pa]
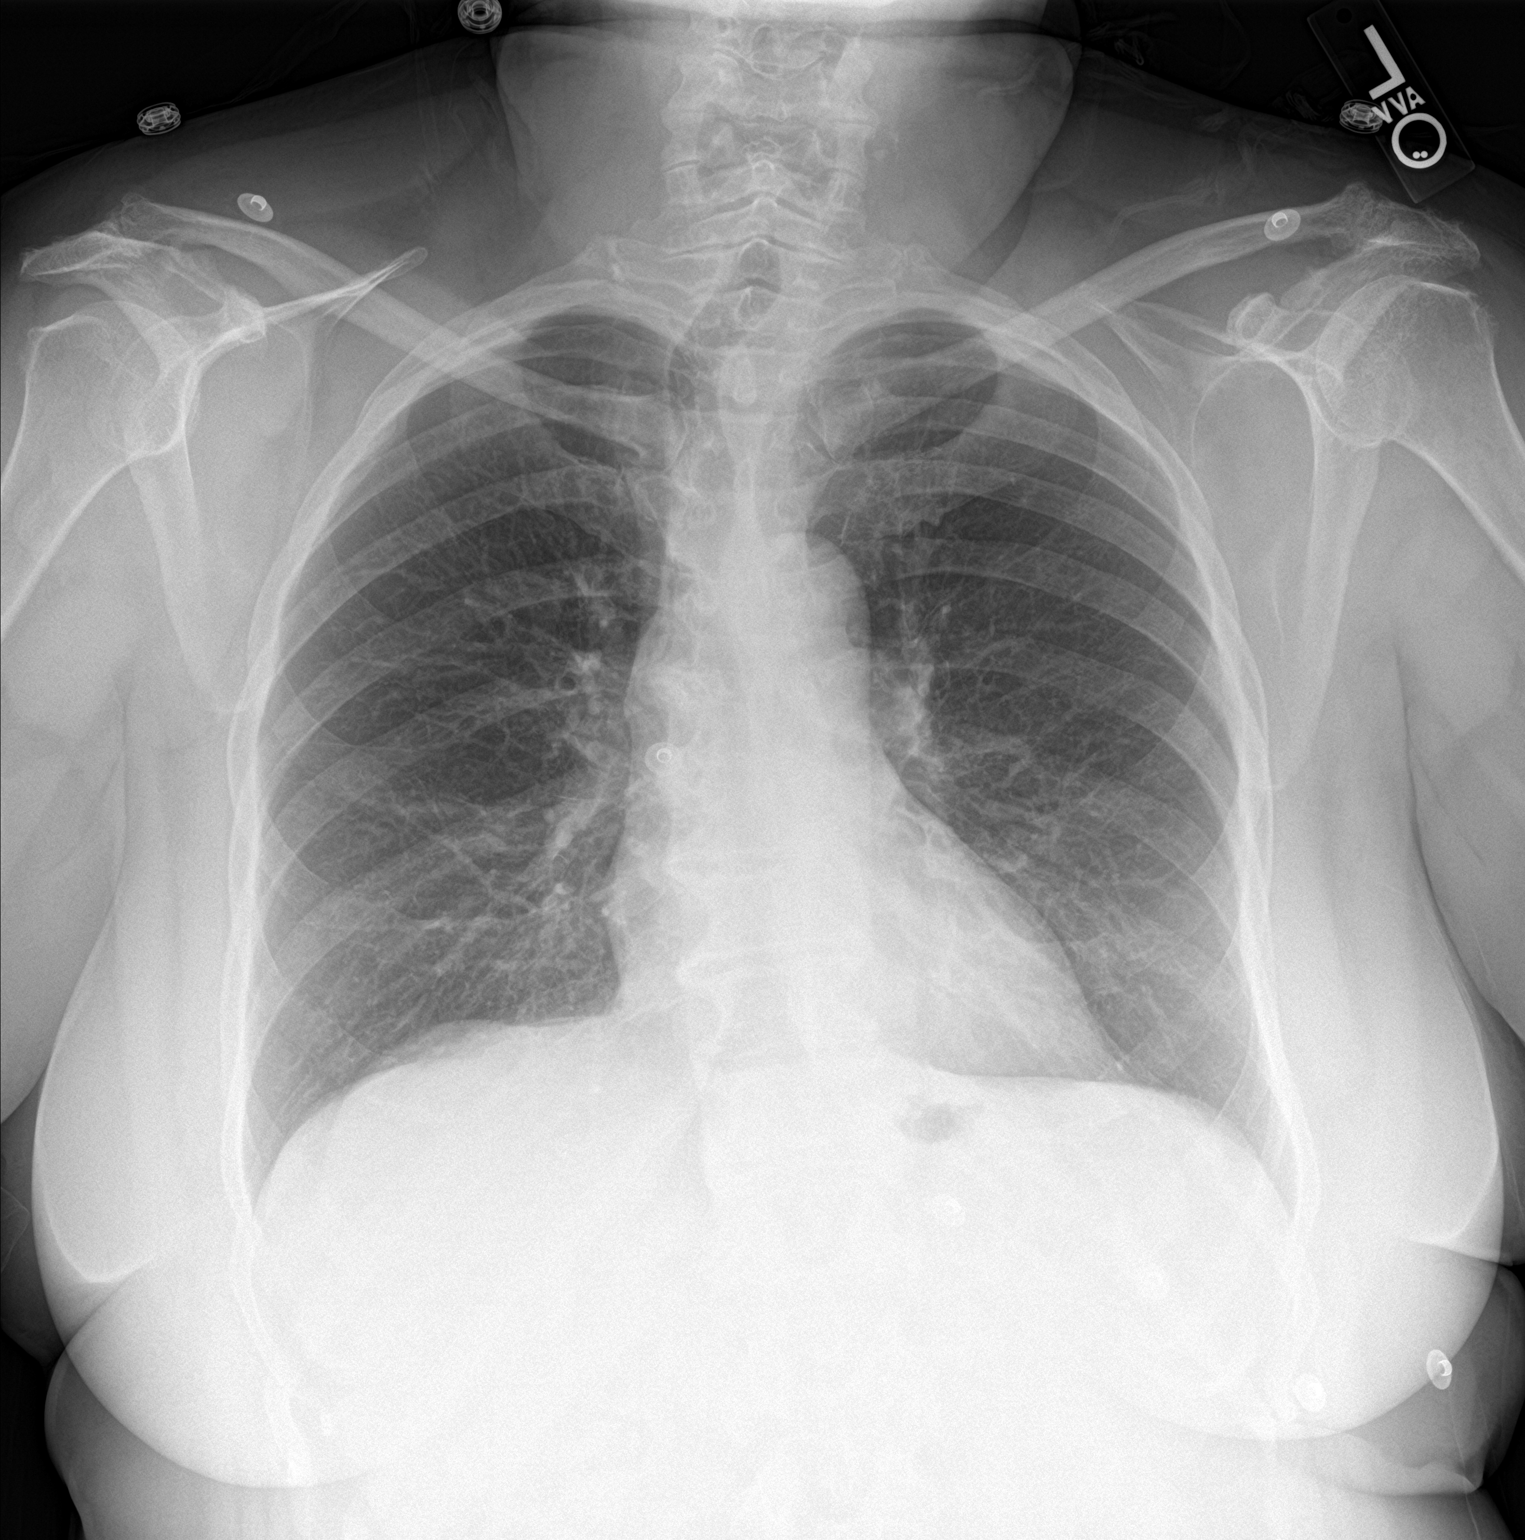

[chest lat]
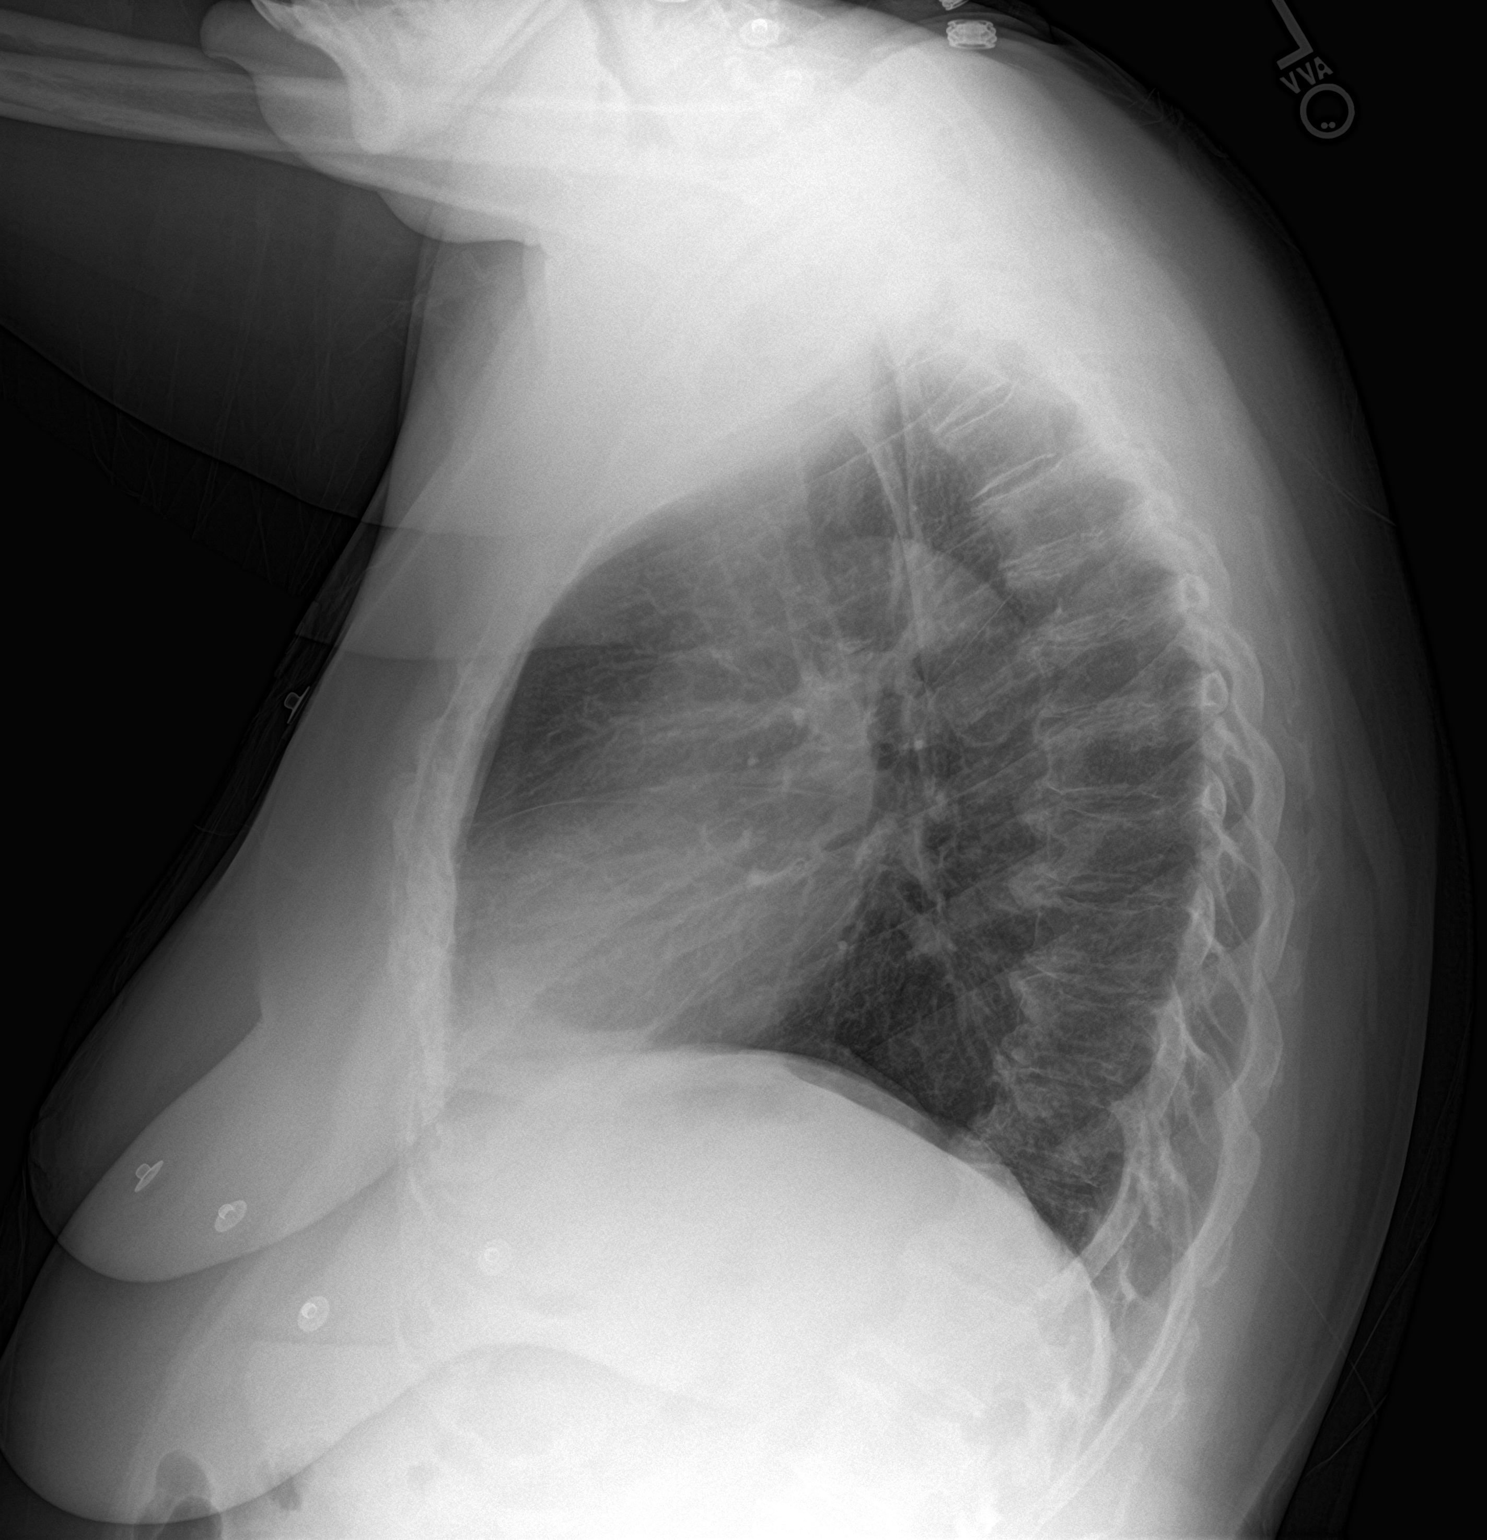

[2 of 2 positions shown; findings below may reference images not displayed]

FINDINGS: The heart size and mediastinal contours are within normal limits.
Unchanged rightward deviation of the trachea the level of the
thoracic inlet. No focal airspace consolidation, pleural effusion,
or pneumothorax. The visualized skeletal structures are
unremarkable.
IMPRESSION: 1. No active cardiopulmonary disease.
2. Unchanged rightward deviation of the trachea at the level of the
thoracic inlet.

## 2023-07-23 IMAGING — CT CT NECK W/ CM
4 of 5 series · 14 of 33 positions shown, 16 images · IV contrast (APPLIED)
Comparison: None.

CLINICAL DATA: Possible thyroid nodule right tracheal deviation

EXAM:
CT NECK WITH CONTRAST
TECHNIQUE: Multidetector CT imaging of the neck was performed using the
standard protocol following the bolus administration of intravenous
contrast.

[Series 4: axial bone · axial · 0.53mm/px · z∈[-235,-119]mm · 3 of 117 slices shown, 4 images]
[im 30/117  soft-tissue]
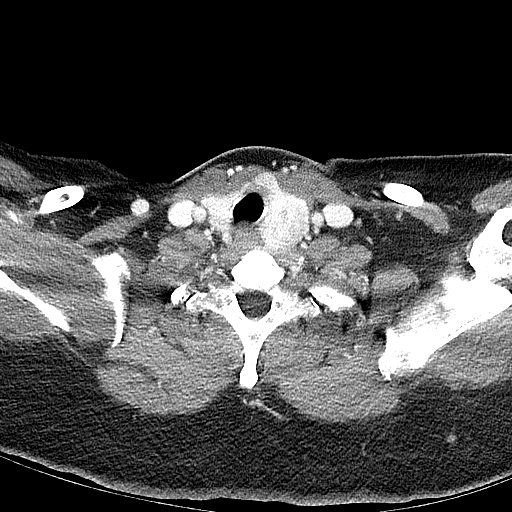
[im 30/117  bone]
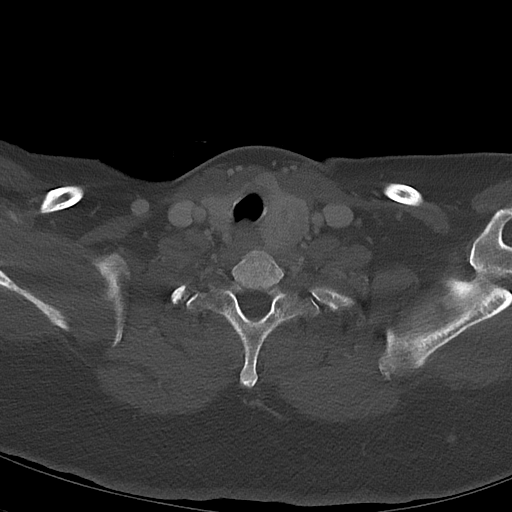
[im 59/117  bone]
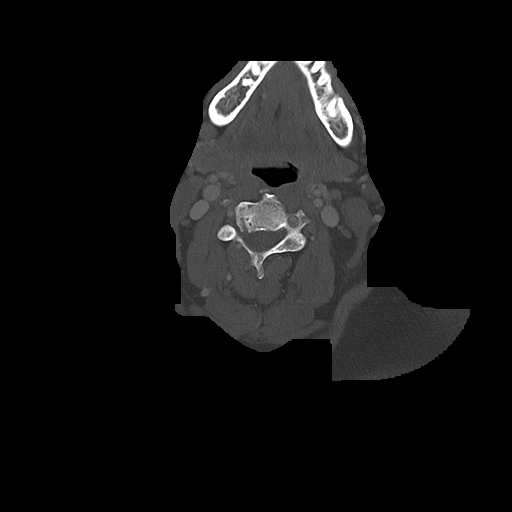
[im 88/117  bone]
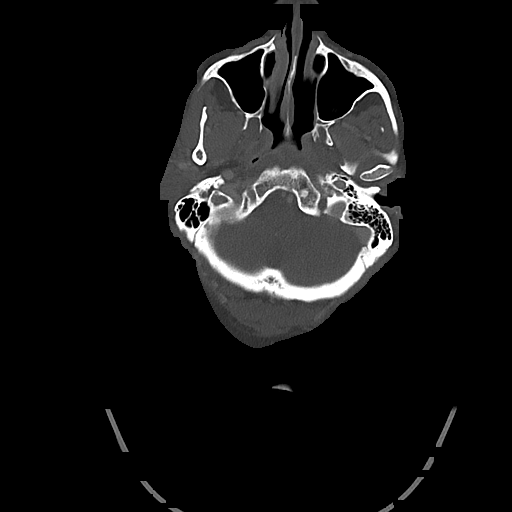

[Series 5: sag neck · sagittal · 0.50mm/px · 5 of 79 slices shown, 6 images]
[im 27/79  bone]
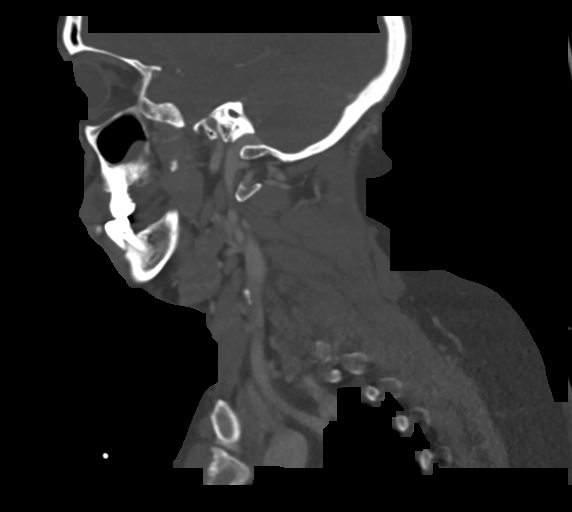
[im 33/79  bone]
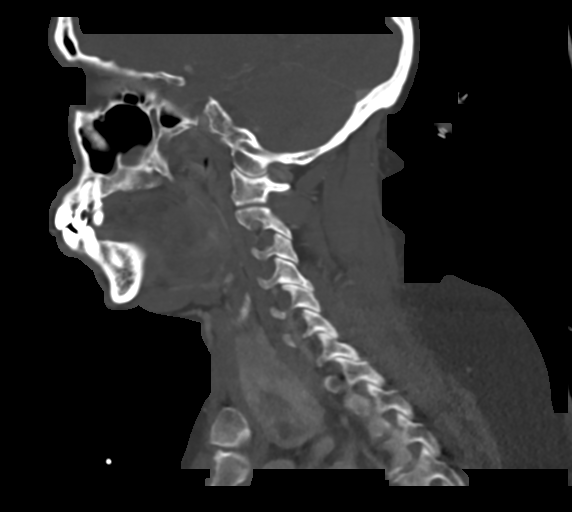
[im 40/79  soft-tissue]
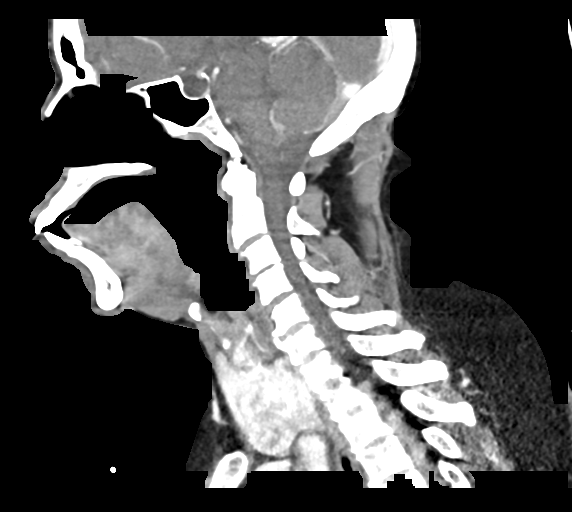
[im 40/79  bone]
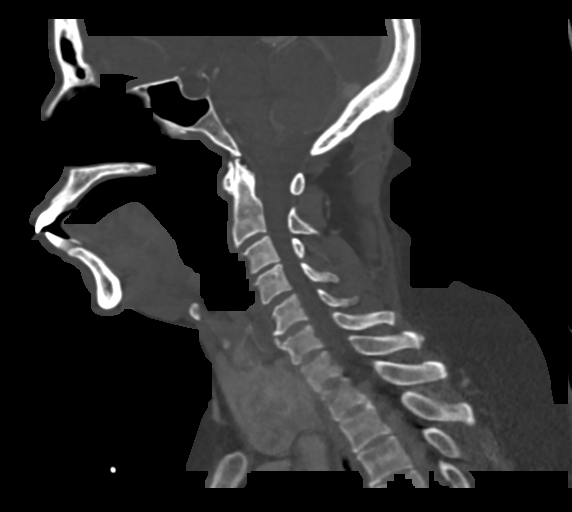
[im 46/79  bone]
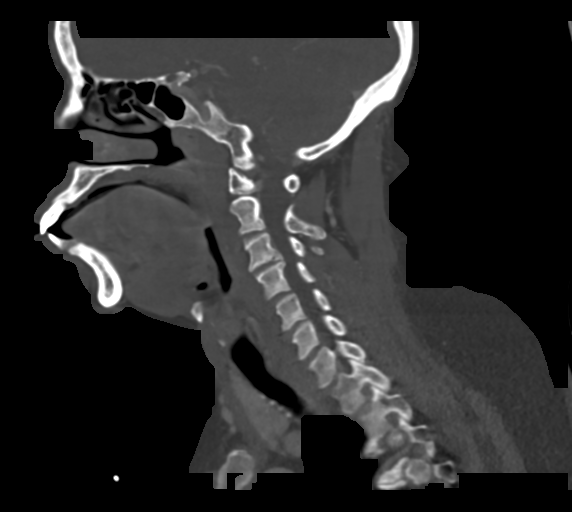
[im 53/79  bone]
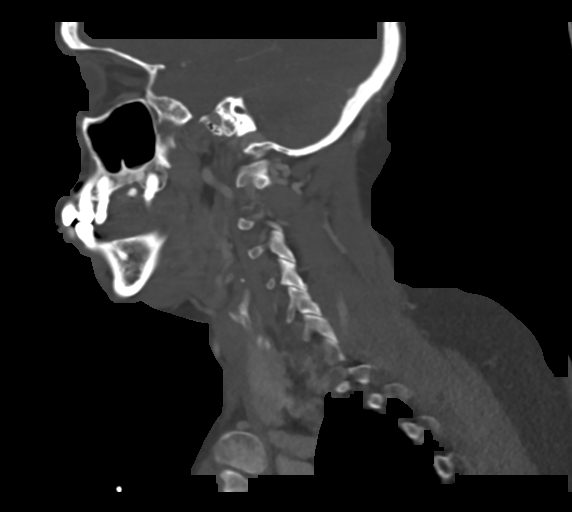

[Series 6: cor neck · coronal · 0.52mm/px · 3 of 117 slices shown]
[im 24/117  bone]
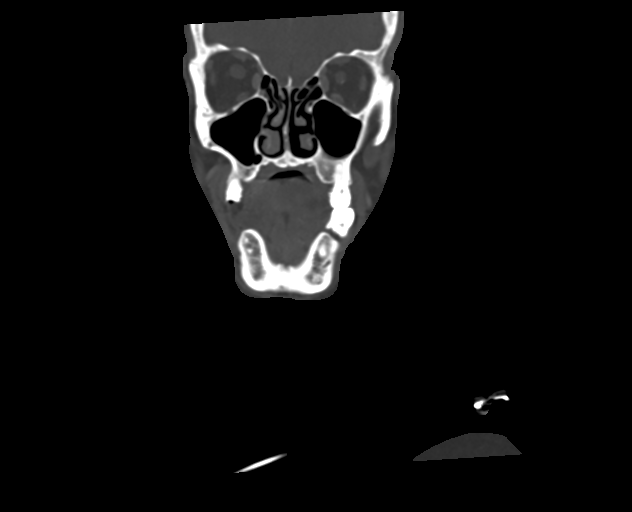
[im 47/117  bone]
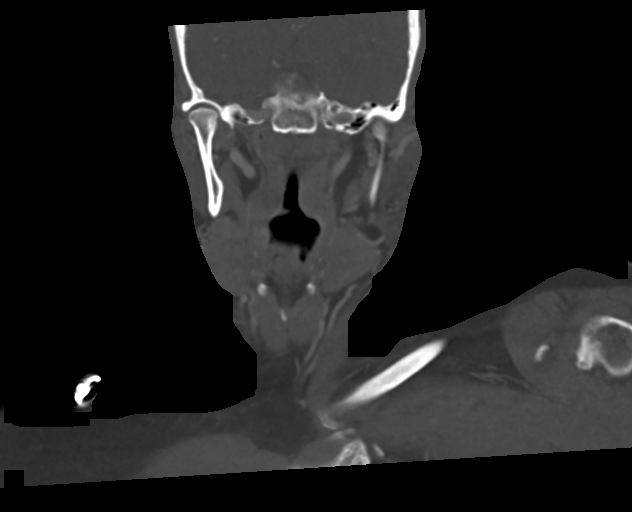
[im 70/117  bone]
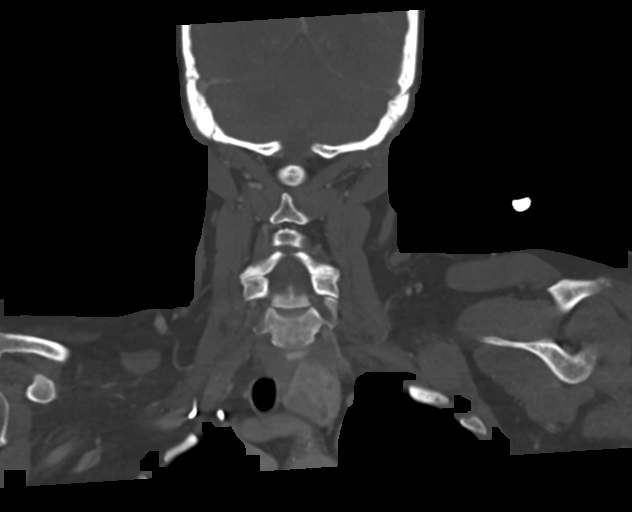

[Series 7: ax oropharynx · axial · 0.44mm/px · z∈[-264,-139]mm · 3 of 128 slices shown]
[im 32/128  bone]
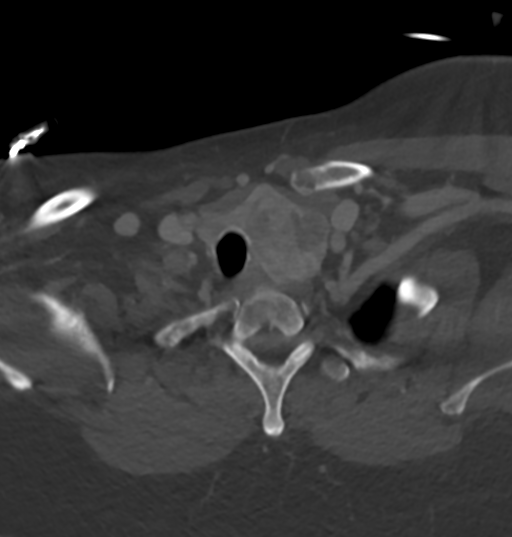
[im 64/128  bone]
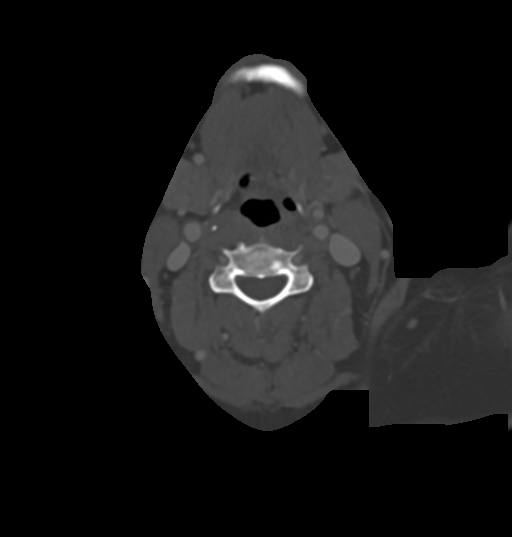
[im 96/128  bone]
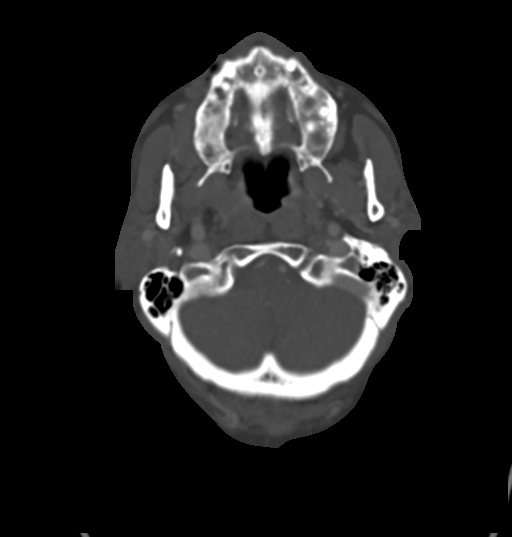

[14 of 33 positions shown; findings below may reference images not displayed]

RADIATION DOSE REDUCTION: This exam was performed according to the
departmental dose-optimization program which includes automated
exposure control, adjustment of the mA and/or kV according to
patient size and/or use of iterative reconstruction technique.

CONTRAST:  100mL OMNIPAQUE IOHEXOL 300 MG/ML  SOLN
FINDINGS: Pharynx and larynx: Rightward deviation of the trachea, due to
enlarged left thyroid lobe (see below),. The larynx and pharynx are
otherwise unremarkable.

Salivary glands: No inflammation, mass, or stone.

Thyroid: Significant enlargement of the left thyroid lobe, likely
with multiple nodules, concerning for multinodular goiter.

Lymph nodes: None enlarged or abnormal density.

Vascular: Patent vasculature.

Limited intracranial: Negative.

Visualized orbits: No acute finding.

Mastoids and visualized paranasal sinuses: Mucous retention cysts in
the left maxillary sinus. Otherwise clear paranasal sinuses. The
mastoids are well aerated.

Skeleton: No acute osseous abnormality. Poor dentition, with
periapical lucency about the roots of the right maxillary molars.

Upper chest: Please see same-day CT chest

Other: None.
IMPRESSION: Enlarged left thyroid lobe, with multiple nodules, concerning for
multinodular goiter which causes rightward deviation of the trachea.
If this has not previously been evaluated, an ultrasound of the
thyroid is recommended. (Reference: [HOSPITAL]. 8368

## 2023-07-23 IMAGING — CT CT CHEST W/ CM
2 of 4 series · 15 of 36 positions shown, 18 images · IV contrast (agent unspecified)
Comparison: None.

CLINICAL DATA: Chest pain.

EXAM:
CT CHEST WITH CONTRAST
TECHNIQUE: Multidetector CT imaging of the chest was performed during
intravenous contrast administration.

[Series 4: chest w · axial · 0.70mm/px · z∈[-487,-245]mm · 12 of 143 slices shown, 15 images]
[im 11/143  mediastinal]
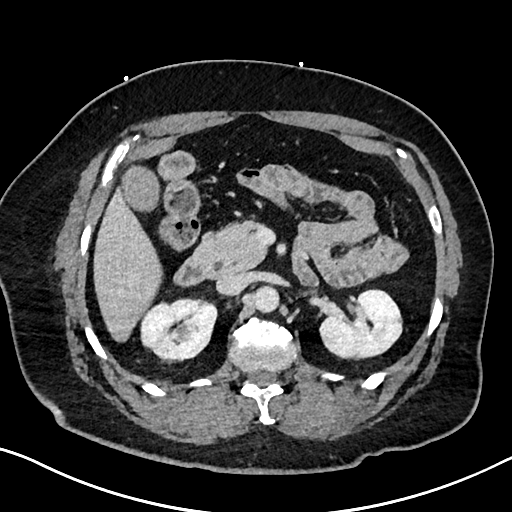
[im 11/143  lung]
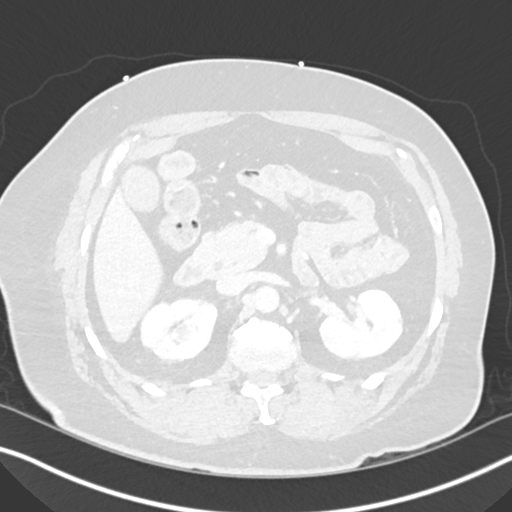
[im 21/143  lung]
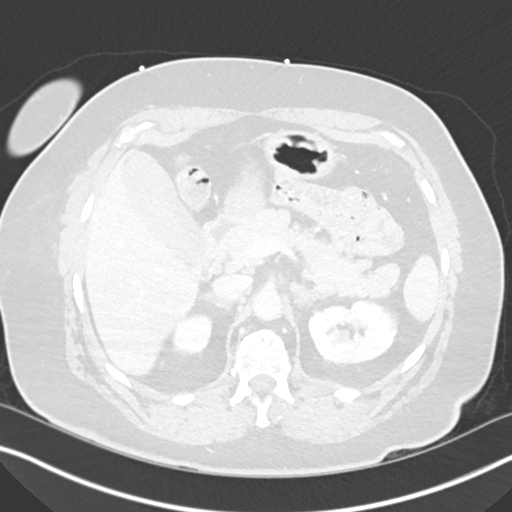
[im 31/143  lung]
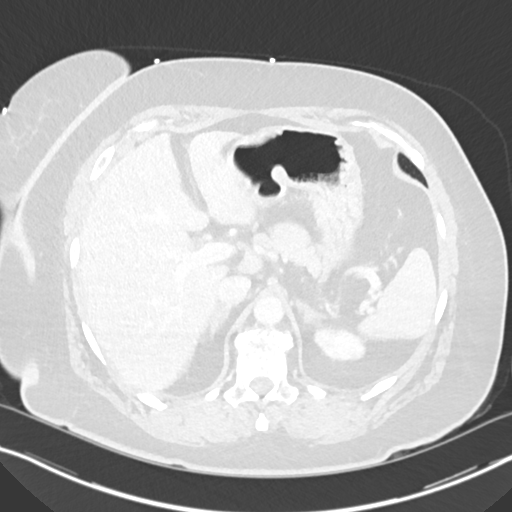
[im 41/143  lung]
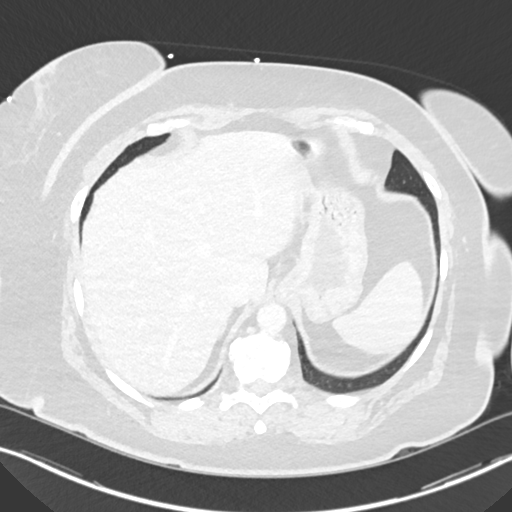
[im 51/143  mediastinal]
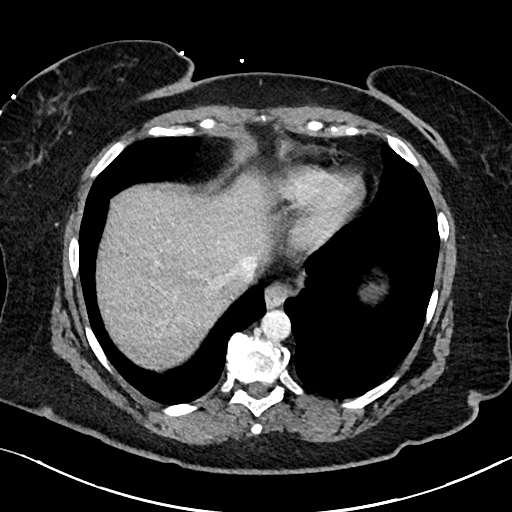
[im 51/143  lung]
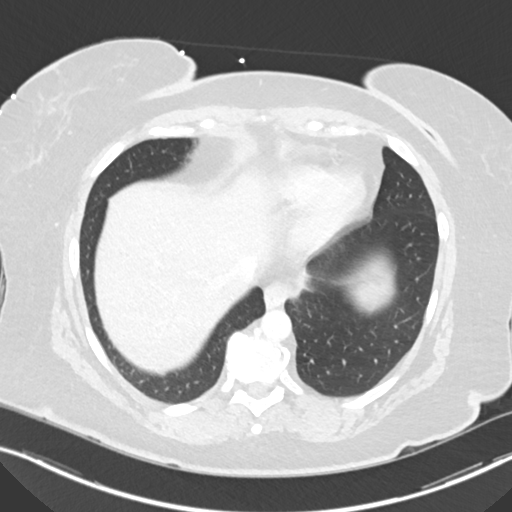
[im 61/143  lung]
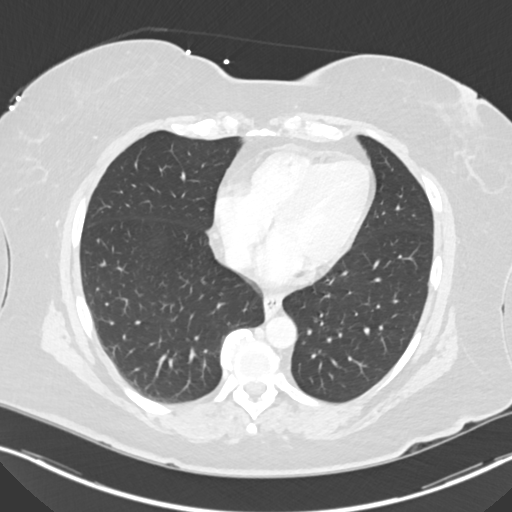
[im 82/143  lung]
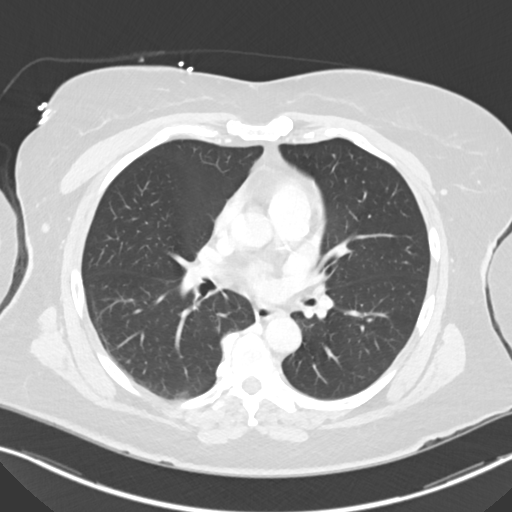
[im 92/143  lung]
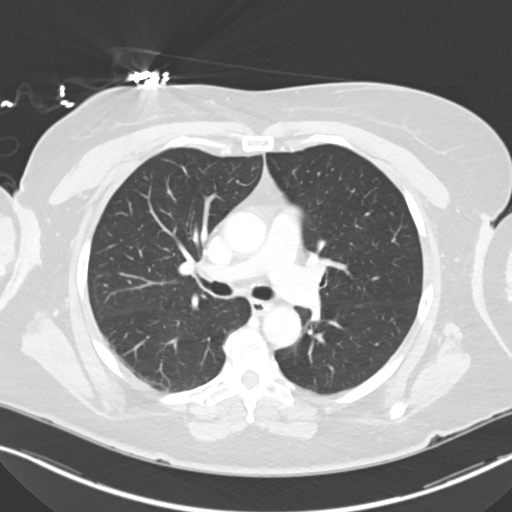
[im 102/143  mediastinal]
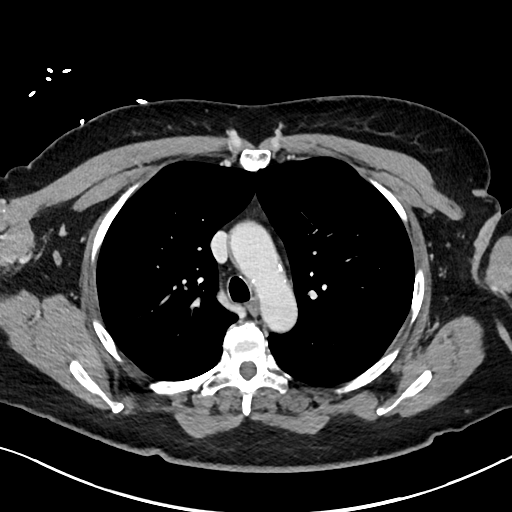
[im 102/143  lung]
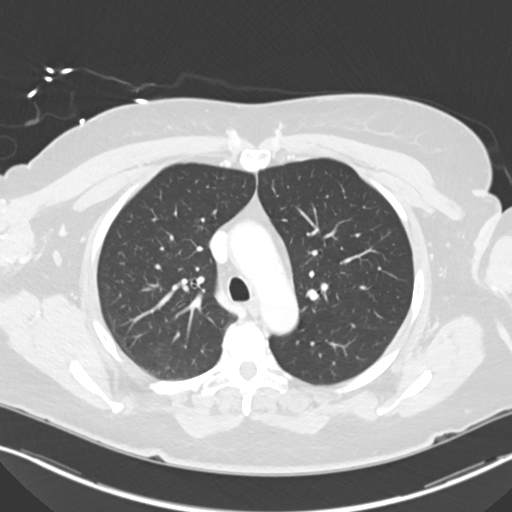
[im 112/143  lung]
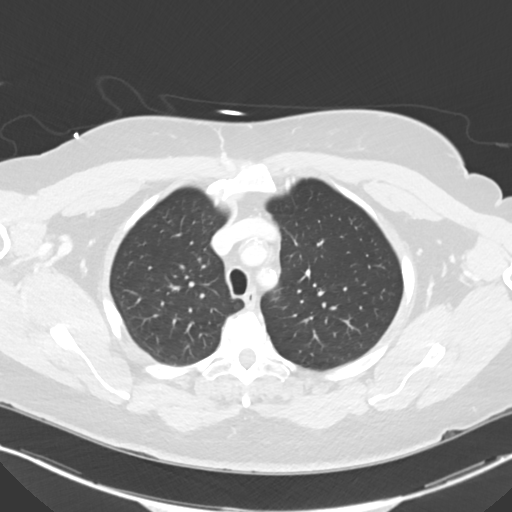
[im 122/143  lung]
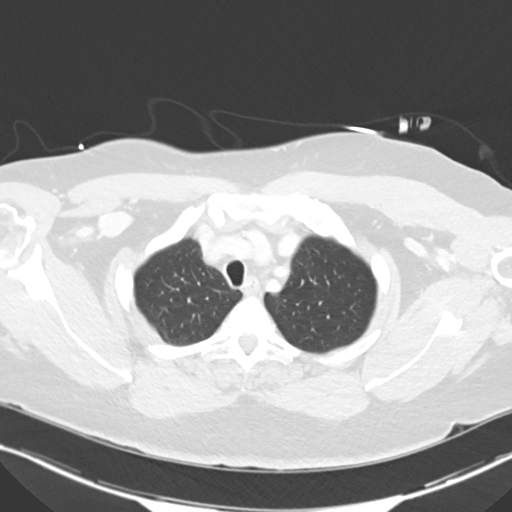
[im 132/143  lung]
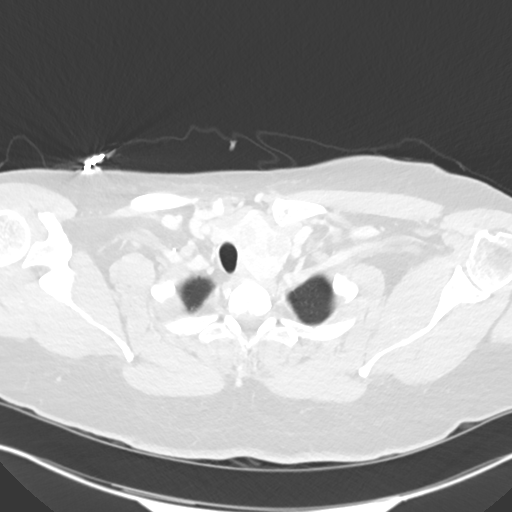

[Series 7: cor · coronal · 0.65mm/px · 3 of 140 slices shown]
[im 28/140  lung]
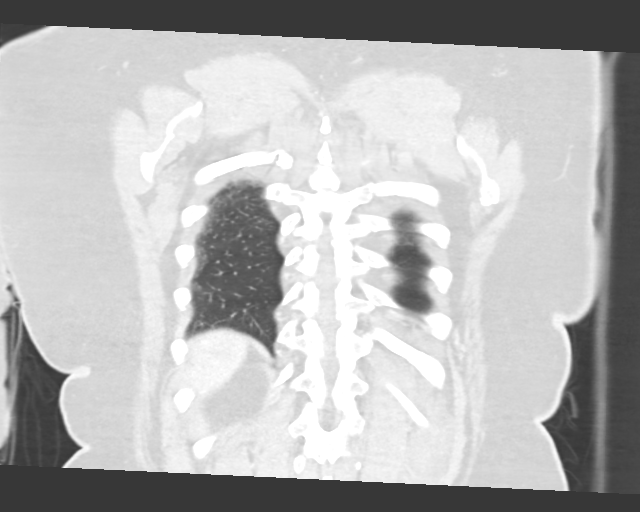
[im 56/140  lung]
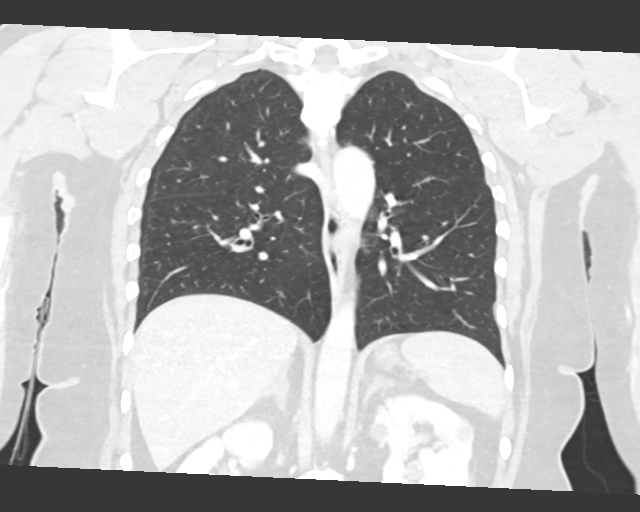
[im 84/140  lung]
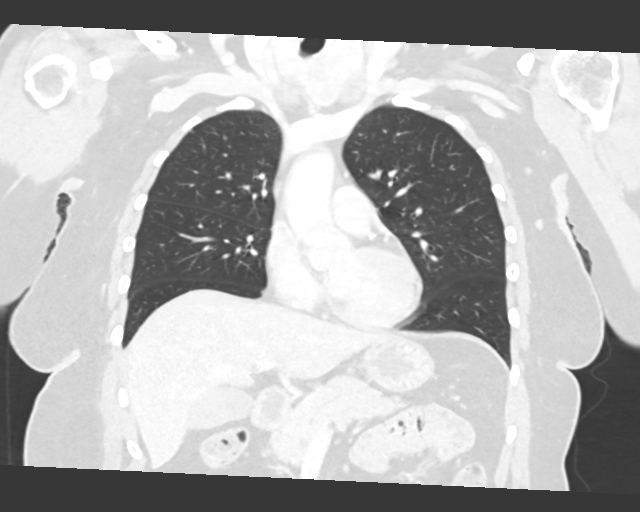

[15 of 36 positions shown; findings below may reference images not displayed]

RADIATION DOSE REDUCTION: This exam was performed according to the
departmental dose-optimization program which includes automated
exposure control, adjustment of the mA and/or kV according to
patient size and/or use of iterative reconstruction technique.

CONTRAST:  100mL OMNIPAQUE IOHEXOL 300 MG/ML  SOLN
FINDINGS: Cardiovascular: There is moderate to marked severity calcification
of the aortic arch, without evidence of aortic aneurysm or
dissection. Normal heart size with moderate to marked severity
coronary artery calcification. No pericardial effusion.

Mediastinum/Nodes: No enlarged mediastinal, hilar, or axillary lymph
nodes. Multiple thyroid nodules are seen within an enlarged,
heterogeneous left lobe of the thyroid gland. The trachea and
esophagus demonstrate no significant findings.

Lungs/Pleura: Lungs are clear. No pleural effusion or pneumothorax.

Upper Abdomen: There is a small hiatal hernia.

Mild diffuse bilateral adrenal gland enlargement is noted.

Multiple bilateral simple renal cysts of various sizes are noted.

Musculoskeletal: Degenerative changes seen throughout the thoracic
spine.
IMPRESSION: 1. No acute or active cardiopulmonary disease.
2. Findings likely consistent with a multinodular goiter. Recommend
thyroid US. Reference: [HOSPITAL]. [DATE]): 143-50
3. Small hiatal hernia.

Aortic Atherosclerosis ([id number]-[id number]).
